# Patient Record
Sex: Male | Born: 2000 | Race: Black or African American | Hispanic: No | Marital: Single | Smoking: Never smoker
Health system: Southern US, Community
[De-identification: ages and names within clinical notes are randomized; demographics above are authoritative.]

---
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
## 2019-04-17 ENCOUNTER — Ambulatory Visit (HOSPITAL_COMMUNITY)
Admission: EM | Admit: 2019-04-17 | Discharge: 2019-04-17 | Disposition: A | Discharge status: Home / Self Care | Payer: Medicaid Other | Attending: Family Medicine | Admitting: Family Medicine

## 2019-04-17 ENCOUNTER — Encounter (HOSPITAL_COMMUNITY): Payer: Self-pay

## 2019-04-17 ENCOUNTER — Other Ambulatory Visit: Payer: Self-pay

## 2019-04-17 ENCOUNTER — Ambulatory Visit (INDEPENDENT_AMBULATORY_CARE_PROVIDER_SITE_OTHER): Payer: Medicaid Other

## 2019-04-17 DIAGNOSIS — K529 Noninfective gastroenteritis and colitis, unspecified: Secondary | ICD-10-CM | POA: Diagnosis not present

## 2019-04-17 DIAGNOSIS — R198 Other specified symptoms and signs involving the digestive system and abdomen: Secondary | ICD-10-CM

## 2019-04-17 DIAGNOSIS — H01001 Unspecified blepharitis right upper eyelid: Secondary | ICD-10-CM | POA: Diagnosis not present

## 2019-04-17 MED ORDER — ONDANSETRON 4 MG PO TBDP
ORAL_TABLET | ORAL | Status: AC
  Filled 2019-04-17: qty 1

## 2019-04-17 MED ORDER — ONDANSETRON 4 MG PO TBDP: 4.0000 mg | ORAL_TABLET | Freq: Once | ORAL | Status: DC

## 2019-04-17 MED ORDER — ONDANSETRON HCL 4 MG PO TABS: 4.0000 mg | ORAL_TABLET | Freq: Four times a day (QID) | ORAL | 0 refills | Status: DC

## 2019-04-17 MED ORDER — ERYTHROMYCIN 5 MG/GM OP OINT: TOPICAL_OINTMENT | OPHTHALMIC | 0 refills | Status: DC

## 2019-04-17 NOTE — ED Provider Notes (Signed)
[facility]   [id number] 04/17/19 Arrival Time: 1642  CC: ABDOMINAL DISCOMFORT  SUBJECTIVE:  Danny Nguyen is a 18 y.o. male who presents with complaint of nausea, vomiting, and abdominal discomfort and changes in bowel movements x 3-4 days. Reports sister with similar symptoms. Denies a precipitating event, trauma, recent travel or antibiotic use.  Localizes pain to epigastric region.  Describes as stable, constant and sharp in character.  8/10.  Has tried OTC medications including PEPTO and tylenol without relief.  Symptoms made worse with having a bowel movement.  Denies similar symptoms in the past.  Last BM today with loose stools, blood in stool and blood with wiping.  Complains of fever, chills, decreased appetite, nausea, and vomiting x 8 episodes over the past 3 days.    Denies weight changes, chest pain, SOB, diarrhea, constipation, melena, dysuria, difficulty urinating, increased frequency or urgency, flank pain, loss of bowel or bladder function.  Also mentions eyelid swelling and redness x 2 days.    No LMP for male patient.  ROS: As per HPI.  History reviewed. No pertinent past medical history. History reviewed. No pertinent surgical history. Allergies  Allergen Reactions   Peanuts [Peanut Oil]    No current facility-administered medications on file prior to encounter.    Current Outpatient Medications on File Prior to Encounter  Medication Sig Dispense Refill   hydrocortisone cream 1 % Apply to affected area bilateral arms 2 times daily x 5 days 15 g 0   methocarbamol (ROBAXIN) 500 MG tablet Take 1 tablet (500 mg total) by mouth 2 (two) times daily. 20 tablet 0   sulfamethoxazole-trimethoprim (BACTRIM DS,SEPTRA DS) 800-160 MG tablet Take 1 tablet by mouth 2 (two) times daily. 14 tablet 0   Social History   Socioeconomic History   Marital status: Single    Spouse name: Not on file   Number of children: Not on file   Years of education: Not on file    Highest education level: Not on file  Occupational History   Not on file  Social Needs   Financial resource strain: Not on file   Food insecurity:    Worry: Not on file    Inability: Not on file   Transportation needs:    Medical: Not on file    Non-medical: Not on file  Tobacco Use   Smoking status: Never Smoker  Substance and Sexual Activity   Alcohol use: No   Drug use: No   Sexual activity: Not on file  Lifestyle   Physical activity:    Days per week: Not on file    Minutes per session: Not on file   Stress: Not on file  Relationships   Social connections:    Talks on phone: Not on file    Gets together: Not on file    Attends religious service: Not on file    Active member of club or organization: Not on file    Attends meetings of clubs or organizations: Not on file    Relationship status: Not on file   Intimate partner violence:    Fear of current or ex partner: Not on file    Emotionally abused: Not on file    Physically abused: Not on file    Forced sexual activity: Not on file  Other Topics Concern   Not on file  Social History Narrative   Not on file   Family History  Problem Relation Age of Onset   Diabetes Father  Diabetes Paternal Grandfather      OBJECTIVE:  Vitals:   04/17/19 1654  BP: 109/83  Pulse: 99  Resp: 20  Temp: 100.3 F (37.9 C)  SpO2: 100%    General appearance: Alert; appears fatigued but nontoxic HEENT: NCAT;  Eyes: PERRL, EOMI, RT upper eyelid with mild swelling and erythema, no obvious drainage; oropharynx clear.  Lungs: clear to auscultation bilaterally without adventitious breath sounds Heart: regular rate and rhythm.   Abdomen: soft, non-distended. protuberant; hypoactive bowel sounds; diffuse tenderness to palpation over the abdomen; nontender at McBurney's point; negative Murphy's sign; negative rebound; no guarding Rectal: Declines Back: no CVA tenderness Extremities: no edema; symmetrical with  no gross deformities Skin: warm and dry Neurologic: normal gait Psychological: alert and cooperative; normal mood and affect  DIAGNOSTIC STUDIES: Dg Abd Acute W/chest  Result Date: 04/17/2019 CLINICAL DATA:  Diffuse abdominal pain EXAM: DG ABDOMEN ACUTE W/ 1V CHEST COMPARISON:  12/20/2018, 03/27/2006 FINDINGS: There is no evidence of dilated bowel loops or free intraperitoneal air. No radiopaque calculi or other significant radiographic abnormality is seen. Heart size and mediastinal contours are within normal limits. Both lungs are clear. IMPRESSION: Negative abdominal radiographs.  No acute cardiopulmonary disease. Electronically Signed   By: Jasmine Pang M.D.   On: 04/17/2019 18:03     ASSESSMENT & PLAN:  1. Gastroenteritis   2. Change in bowel movement   3. Blepharitis of right upper eyelid, unspecified type     Meds ordered this encounter  Medications   ondansetron (ZOFRAN-ODT) disintegrating tablet 4 mg   Viral gastroenteritis:  Nausea, vomiting and loose stools most likely related to a viral illness.  Zofran given in office Get rest and drink fluids.  You may supplement with OTC pedialyte or oral rehydration solution Zofran prescribed.  Use as needed for nausea.    DIET Instructions:  30 minutes after taking nausea medicine, begin with sips of clear liquids. If able to hold down 2 - 4 ounces for 30 minutes, begin drinking more. Increase your fluid intake to replace losses. Clear liquids only for 24 hours (water, tea, sport drinks, clear flat ginger ale or cola and juices, broth, jello, popsicles, ect). Advance to bland foods, applesauce, rice, baked or boiled chicken, ect. Avoid milk, greasy foods and anything that doesnt agree with you.  If you experience new or worsening symptoms return or go to ER such as fever, chills, nausea, vomiting, diarrhea, bloody or dark tarry stools, constipation, urinary symptoms, worsening abdominal discomfort, symptoms that do not improve  with medications, inability to keep fluids down, etc...  Changes in bowel movements:  Declines rectal exam today. Advised of risk associated with this decision including missed diagnosis.   X-ray not concerning for obstruction, perforation or significant stool burden Painful bowel movements with blood most likely related to hemorrhoids.   Perform sitz water baths and increase you water and fiber intake Follow up with PCP or Joaquin Courts FNP for further evaluation and management  Blepharitis:  Continue warm compresses at home.  Soak a wash cloth in warm (not scalding) water and place it over the eyes. As the wash cloth cools, it should be rewarmed and replaced for a total of 5 to 10 minutes of soaking time. Warm compresses should be applied two to four times a day as long as the patient has symptoms Perform lid washing: Either warm water or very dilute baby shampoo can be placed on a clean wash cloth, gauze pad, or cotton swab. Then be advised  to gently clean along the lashes and lid margin to remove the accumulated material with care to avoid contacting the ocular surface. If shampoo is used, thorough rinsing is recommended. Vigorous washing should be avoided, as it may cause more irritation.  Prescribed erythromycin ointment.  Apply up to 6 times daily for 5-7 days, or until symptomatic improvement Follow up with opthalmology for further evaluation and management Return or go to ER if you have any new or worsening symptoms including increased redness, swelling, discharge, vision changes, persistent symptoms despite treatment, etc...   Reviewed expectations re: course of current medical issues. Questions answered. Outlined signs and symptoms indicating need for more acute intervention. Patient verbalized understanding. After Visit Summary given.   Rennis Harding, PA-C 04/17/19 1845

## 2019-04-17 NOTE — Discharge Instructions (Addendum)
Viral gastroenteritis:  Nausea, vomiting and loose stools most likely related to a viral illness.  Zofran given in office Get rest and drink fluids.  You may supplement with OTC pedialyte or oral rehydration solution Zofran prescribed.  Use as needed for nausea.    DIET Instructions:  30 minutes after taking nausea medicine, begin with sips of clear liquids. If able to hold down 2 - 4 ounces for 30 minutes, begin drinking more. Increase your fluid intake to replace losses. Clear liquids only for 24 hours (water, tea, sport drinks, clear flat ginger ale or cola and juices, broth, jello, popsicles, ect). Advance to bland foods, applesauce, rice, baked or boiled chicken, ect. Avoid milk, greasy foods and anything that doesnt agree with you.  If you experience new or worsening symptoms return or go to ER such as fever, chills, nausea, vomiting, diarrhea, bloody or dark tarry stools, constipation, urinary symptoms, worsening abdominal discomfort, symptoms that do not improve with medications, inability to keep fluids down, etc...  Changes in bowel movements:  Declines rectal exam today. Advised of risk associated with this decision including missed diagnosis.   X-ray not concerning for obstruction, perforation or significant stool burden Painful bowel movements with blood most likely related to hemorrhoids.   Perform sitz water baths and increase you water and fiber intake Follow up with PCP or Joaquin Courts FNP for further evaluation and management  Blepharitis:  Continue warm compresses at home.  Soak a wash cloth in warm (not scalding) water and place it over the eyes. As the wash cloth cools, it should be rewarmed and replaced for a total of 5 to 10 minutes of soaking time. Warm compresses should be applied two to four times a day as long as the patient has symptoms Perform lid washing: Either warm water or very dilute baby shampoo can be placed on a clean wash cloth, gauze pad, or cotton  swab. Then be advised to gently clean along the lashes and lid margin to remove the accumulated material with care to avoid contacting the ocular surface. If shampoo is used, thorough rinsing is recommended. Vigorous washing should be avoided, as it may cause more irritation.  Prescribed erythromycin ointment.  Apply up to 6 times daily for 5-7 days, or until symptomatic improvement Referral to opthalmology for further evaluation and management Return or go to ER if you have any new or worsening symptoms including increased redness, swelling, discharge, vision changes, persistent symptoms despite treatment, etc..Marland Kitchen

## 2019-04-17 NOTE — ED Triage Notes (Signed)
Pt states that he has had abdominal pain for past couple of days with nausea and vomiting, stated that he vomited 2 times today, reports diarrhea but states it is painful to go , right eye also swollen
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
## 2021-03-23 ENCOUNTER — Ambulatory Visit (INDEPENDENT_AMBULATORY_CARE_PROVIDER_SITE_OTHER): Payer: Medicaid Other | Admitting: Family Medicine

## 2021-03-23 ENCOUNTER — Other Ambulatory Visit: Payer: Self-pay

## 2021-03-23 VITALS — BP 122/80 | HR 108 | Ht 67.0 in | Wt 261.4 lb

## 2021-03-23 DIAGNOSIS — J302 Other seasonal allergic rhinitis: Secondary | ICD-10-CM | POA: Diagnosis not present

## 2021-03-23 DIAGNOSIS — Z23 Encounter for immunization: Secondary | ICD-10-CM

## 2021-03-23 DIAGNOSIS — I10 Essential (primary) hypertension: Secondary | ICD-10-CM

## 2021-03-23 LAB — POCT GLYCOSYLATED HEMOGLOBIN (HGB A1C): Hemoglobin A1C: 5.4 % (ref 4.0–5.6)

## 2021-03-23 MED ORDER — CETIRIZINE HCL 10 MG PO TABS: 10.0000 mg | ORAL_TABLET | Freq: Every day | ORAL | 0 refills | Status: AC

## 2021-03-23 NOTE — Patient Instructions (Addendum)
It was great seeing you today! Today we did your initial clinic visit, checked your blood sugars, and gave you your Tdap vaccine. I also refilled your cetirizine.   Please check-out at the front desk before leaving the clinic. I'd like to see you back in the next couple of weeks to follow up on your breathing and current symptoms, as well as finish discussing items we did not get to today. If you need to be seen earlier than that for any new issues we're happy to fit you in, just give [place] a call!  Visit Reminders: - Stop by the pharmacy to pick up your prescription  - Continue to work on your healthy eating habits and incorporating exercise into your daily life.   Regarding lab work today:  Due to recent changes in healthcare laws, you may see the results of your imaging and laboratory studies on MyChart before your provider has had a chance to review them.  I understand that in some cases there may be results that are confusing or concerning to you. Not all laboratory results come back in the same time frame and you may be waiting for multiple results in order to interpret others.  Please give [place] 72 hours in order for your provider to thoroughly review all the results before contacting the office for clarification of your results. If everything is normal, you will get a letter in the mail or a message in My Chart. Please give [place] a call if you do not hear from [place] after 2 weeks.  Please bring all of your medications with you to each visit.    If you haven't already, sign up for My Chart to have easy access to your labs results, and communication with your primary care physician.  Feel free to call with any questions or concerns at any time, at [phone].   Take care,  Dr. Cora Collum [place] [facility]   Psychiatry Resource List (Adults and Children) Most of these providers will take Medicaid. please consult your insurance for a complete and updated list of available  providers. When calling to make an appointment have your insurance information available to confirm you are covered.   [facility]:Psychiatry and Counseling [address] [place], [place] [address] [phone]  [facility]  [address] [place], [place] [phone] Crisis [phone]   [facility]:   [facility]: [address]     [phone]   [facility]: [address] [place]. [place],        [phone] [place]: [address] [phone],    [phone] [place]: [phone] [address],                   [phone] Children: [facility] [address]         [phone]  [facility] (virtual only) [phone]    [facility]  (Psychiatry only; Adults /children 12 and over, will take Medicaid)  [address] Laurell Josephs [address], [place], [place] [address]       [phone]   [facility] (Psychiatry & counseling ; adults & children ; will take Medicaid [address]  [place] [address]  Go on-line to complete referral ( [email or web address] [phone]    (Spanish speaking therapists)  [facility]  Psychiatry & counseling; Adults and children;  Call Registration prior  to scheduling an appointment [phone] [address]    [place], [place] [address]    [phone]  [facility] (Psychiatry & counseling; adults & children; Medicare no Medicaid)  [address]   [place], [place]  [address]      [phone]    [facility] (up to age 34)  Psychiatry & counseling ,will take Medicaid, must do counseling to receive psychiatry services  [address] [place] [address]        [phone]  [facility] (Psychiatry & counseling; adults & children; will take Medicaid) Will need a referral from provider [address],  [place], [place]  [phone]   [facility] --- Walk-In Mon-Friday 8am-3pm ( will take Medicaid, Psychiatry, Adults & children,  [address], [place], [place]   [phone]   [facility], Walk-in M-F 8am-12pm and 1pm -3pm   (Counseling, Psychiatry, will take Medicaid, adults & children)  [address], [place], [place]  [phone]

## 2021-04-01 ENCOUNTER — Encounter: Payer: Self-pay | Admitting: Family Medicine

## 2021-04-01 DIAGNOSIS — J302 Other seasonal allergic rhinitis: Secondary | ICD-10-CM | POA: Insufficient documentation

## 2021-04-08 ENCOUNTER — Ambulatory Visit (INDEPENDENT_AMBULATORY_CARE_PROVIDER_SITE_OTHER): Payer: Medicaid Other

## 2021-04-08 ENCOUNTER — Encounter: Payer: Self-pay | Admitting: Family Medicine

## 2021-04-08 ENCOUNTER — Other Ambulatory Visit: Payer: Self-pay

## 2021-04-08 ENCOUNTER — Ambulatory Visit (INDEPENDENT_AMBULATORY_CARE_PROVIDER_SITE_OTHER): Payer: PRIVATE HEALTH INSURANCE | Admitting: Family Medicine

## 2021-04-08 VITALS — BP 102/70 | HR 67 | Ht 67.0 in | Wt 257.0 lb

## 2021-04-08 DIAGNOSIS — Z23 Encounter for immunization: Secondary | ICD-10-CM | POA: Diagnosis present

## 2021-04-08 DIAGNOSIS — F419 Anxiety disorder, unspecified: Secondary | ICD-10-CM | POA: Diagnosis not present

## 2021-04-08 NOTE — Progress Notes (Addendum)
    SUBJECTIVE:   CHIEF COMPLAINT / HPI:   Danny Nguyen is a 20 yo who presents for his follow up visit from his initial new patient visit on 4/11. He reports his throat feels better. Does still have some nasal congestion and SOB. Stated he did not get his cetirizine because the pharmacy did not call him to let him know they filled the medication.   Patient endorses improvement in his depressed mood. Stated he did not schedule an appointment with a therapist because he is not ready to do so since he had a negative ex[perience with a therapist many years ago. Is also not ready to start antidepessant medication. He does report feeling anxious very often on a daily basis and constantly worried.    OBJECTIVE:   BP 102/70   Pulse 67   Ht 5\' 7"  (1.702 m)   Wt 257 lb (116.6 kg)   SpO2 98%   BMI 40.25 kg/m    Physical exam  General: well appearing, NAD Cardiovascular: RRR, no murmurs Lungs: Diffuse wheezing in all lung fields. Normal WOB Abdomen: soft, non-distended, non-tender Skin: warm, dry. No edema Psych: alert and oriented. Mood and affect appropriate   ASSESSMENT/PLAN:   No problem-specific Assessment & Plan notes found for this encounter.   Depressed and anxious mood  GAD 11 indicating moderate anxiety. PHQ9 4 improved from 7 at last visit.  Gave list of therapists at last visit but does not feel ready to see one.Declines medication at this time. I advised patient to seek therapy when he is ready to do so and also let me know if he wants to start medication at any point.   SOB and wheezing Patient recalls using an inhaler when he was younger but not currently. Diffuse wheezing heard on exam. I believe patient would benefit from PFTs. Will refer to Dr. for spirometry. I also discussed weight loss, nutrition, and increasing physical activity with patient which could be contributing to SOB in addition to his allergies - cetirizine 10mg  daily   Danny Band, DO [facility]

## 2021-04-08 NOTE — Patient Instructions (Signed)
It was great seeing you today!  Today you followed up from your initial visit. We discussed your anxiety and potentially starting medication in the future.   For your wheezing you should get a call in the near future about scheduling a time to check your lung function with our pharmacist here at the clinic.   I'd like to see you back in about 3-4 months to check in on mood and how your breathing is doing, but if you need to be seen earlier than that for any new issues we're happy to fit you in, just give [place] a call!   If you haven't already, sign up for My Chart to have easy access to your labs results, and communication with your primary care physician.  Feel free to call with any questions or concerns at [phone].   Take care,  Dr. Cora Collum [facility]

## 2021-04-14 ENCOUNTER — Ambulatory Visit (HOSPITAL_COMMUNITY)
Admission: EM | Admit: 2021-04-14 | Discharge: 2021-04-14 | Disposition: A | Discharge status: Home / Self Care | Payer: Medicaid Other | Attending: Internal Medicine | Admitting: Internal Medicine

## 2021-04-14 ENCOUNTER — Encounter (HOSPITAL_COMMUNITY): Payer: Self-pay | Admitting: Emergency Medicine

## 2021-04-14 ENCOUNTER — Other Ambulatory Visit: Payer: Self-pay

## 2021-04-14 DIAGNOSIS — R0789 Other chest pain: Secondary | ICD-10-CM

## 2021-04-14 MED ORDER — IBUPROFEN 800 MG PO TABS: 800.0000 mg | ORAL_TABLET | Freq: Three times a day (TID) | ORAL | 0 refills | Status: AC | PRN

## 2021-04-14 NOTE — Discharge Instructions (Signed)
Gentle range of motion exercises Deep breathing exercises Heating pad use on a 10-minute on-10 minutes off cycle will help alleviate pain/discomfort Return to urgent care if symptoms worsen.

## 2021-04-14 NOTE — ED Triage Notes (Signed)
Pt presents with Lower left chest/ pain xs 2 weeks. States hurts more with movement.

## 2021-04-16 NOTE — ED Provider Notes (Signed)
[facility]    CSN: [id number] Arrival date & time: 04/14/21  0840      History   Chief Complaint Chief Complaint  Patient presents with  . Chest Pain    HPI Danny Nguyen is a 20 y.o. malecomes to the urgent care with 2 weeks history of chest pain. Patient describes pain as sharp,constant and aggravated by movement. No relieving factors known.He denies any radiation of pain. No cough or sputum production. No fever or chills. No wheezing.Marland Kitchen   HPI  History reviewed. No pertinent past medical history.  Patient Active Problem List   Diagnosis Date Noted  . Seasonal allergies 04/01/2021  . Rash and nonspecific skin eruption 05/20/2017  . Folliculitis 05/20/2017  . Hyperglycemia 08/08/2014    History reviewed. No pertinent surgical history.     Home Medications    Prior to Admission medications   Medication Sig Start Date End Date Taking? Authorizing Provider  ibuprofen (ADVIL) 800 MG tablet Take 1 tablet (800 mg total) by mouth every 8 (eight) hours as needed. 04/14/21  Yes Jamari Diana, Britta Mccreedy, MD  cetirizine (ZYRTEC) 10 MG tablet Take 1 tablet (10 mg total) by mouth daily. 03/23/21   Cora Collum, DO    Family History Family History  Problem Relation Age of Onset  . Diabetes Father   . Diabetes Paternal Grandfather     Social History Social History   Tobacco Use  . Smoking status: Never Smoker  . Smokeless tobacco: Never Used  Substance Use Topics  . Alcohol use: No  . Drug use: No     Allergies   Peanuts [peanut oil]   Review of Systems Review of Systems  Respiratory: Positive for cough. Negative for chest tightness, shortness of breath, wheezing and stridor.   Cardiovascular: Positive for chest pain. Negative for palpitations.  Gastrointestinal: Negative.   Genitourinary: Negative.   Neurological: Negative.      Physical Exam Triage Vital Signs ED Triage Vitals  Enc Vitals Group     BP 04/14/21 0909 (!) 123/54     Pulse Rate  04/14/21 0909 88     Resp 04/14/21 0909 18     Temp 04/14/21 0909 98.3 F (36.8 C)     Temp Source 04/14/21 0909 Oral     SpO2 04/14/21 0909 98 %     Weight --      Height --      Head Circumference --      Peak Flow --      Pain Score 04/14/21 0907 3     Pain Loc --      Pain Edu? --      Excl. in GC? --    No data found.  Updated Vital Signs BP (!) 123/54 (BP Location: Right Arm)   Pulse 88   Temp 98.3 F (36.8 C) (Oral)   Resp 18   SpO2 98%   Visual Acuity Right Eye Distance:   Left Eye Distance:   Bilateral Distance:    Right Eye Near:   Left Eye Near:    Bilateral Near:     Physical Exam Vitals and nursing note reviewed.  Constitutional:      Appearance: He is not ill-appearing.  Cardiovascular:     Rate and Rhythm: Normal rate and regular rhythm.     Heart sounds: Normal heart sounds.  Pulmonary:     Effort: Pulmonary effort is normal. No tachypnea or accessory muscle usage.     Breath sounds: No  decreased breath sounds.  Abdominal:     General: Bowel sounds are normal.     Palpations: Abdomen is soft.  Neurological:     Mental Status: He is alert.      UC Treatments / Results  Labs (all labs ordered are listed, but only abnormal results are displayed) Labs Reviewed - No data to display  EKG   Radiology No results found.  Procedures Procedures (including critical care time)  Medications Ordered in UC Medications - No data to display  Initial Impression / Assessment and Plan / UC Course  I have reviewed the triage vital signs and the nursing notes.  Pertinent labs & imaging results that were available during my care of the patient were reviewed by me and considered in my medical decision making (see chart for details).     1. Chest wall pain: Gentle range of motion exercises Heating pad use Ibuprofen 800 mg every 8 hours as needed for pain Return precautions given Risk for cardiac etiology is low given the atypical nature of the  symptoms in the absence of risk factors.  Final Clinical Impressions(s) / UC Diagnoses   Final diagnoses:  Chest wall pain     Discharge Instructions     Gentle range of motion exercises Deep breathing exercises Heating pad use on a 10-minute on-10 minutes off cycle will help alleviate pain/discomfort Return to urgent care if symptoms worsen.    ED Prescriptions    Medication Sig Dispense Auth. Provider   ibuprofen (ADVIL) 800 MG tablet Take 1 tablet (800 mg total) by mouth every 8 (eight) hours as needed. 21 tablet Sieara Bremer, Britta Mccreedy, MD     PDMP not reviewed this encounter.   Merrilee Jansky, MD 04/19/21 1034

## 2023-02-22 ENCOUNTER — Ambulatory Visit (INDEPENDENT_AMBULATORY_CARE_PROVIDER_SITE_OTHER): Payer: Medicaid Other

## 2023-02-22 DIAGNOSIS — Z111 Encounter for screening for respiratory tuberculosis: Secondary | ICD-10-CM

## 2023-02-22 NOTE — Progress Notes (Signed)
Patient is here for a PPD placement.  PPD placed in left forearm @ 0955 am.  Patient will return 02/24/2023 to have PPD read. Talbot Grumbling, RN

## 2023-02-24 ENCOUNTER — Ambulatory Visit: Payer: Medicaid Other

## 2023-02-24 DIAGNOSIS — Z111 Encounter for screening for respiratory tuberculosis: Secondary | ICD-10-CM

## 2023-02-24 LAB — TB SKIN TEST
Induration: 0 mm
TB Skin Test: NEGATIVE

## 2023-02-24 NOTE — Progress Notes (Signed)
I have reviewed this visit and agree with the documentation.   

## 2023-02-24 NOTE — Progress Notes (Signed)
PPD Reading Note PPD read and results entered in EpicCare. Result: 0 mm induration. Interpretation: Negative Allergic reaction: No  

## 2023-02-25 NOTE — Progress Notes (Signed)
I was preceptor for this office visit.
